# Patient Record
Sex: Female | Born: 1980 | Race: White | Hispanic: No | Marital: Married | Smoking: Never smoker
Health system: Southern US, Community
[De-identification: ages and names within clinical notes are randomized; demographics above are authoritative.]

## PROBLEM LIST (undated history)

## (undated) DIAGNOSIS — G43909 Migraine, unspecified, not intractable, without status migrainosus: Secondary | ICD-10-CM

---

## 2019-03-17 ENCOUNTER — Other Ambulatory Visit: Payer: Self-pay | Admitting: Internal Medicine

## 2019-03-17 DIAGNOSIS — Z20822 Contact with and (suspected) exposure to covid-19: Secondary | ICD-10-CM

## 2019-03-18 LAB — NOVEL CORONAVIRUS, NAA: SARS-CoV-2, NAA: NOT DETECTED

## 2019-04-25 ENCOUNTER — Ambulatory Visit
Admission: EM | Admit: 2019-04-25 | Discharge: 2019-04-25 | Disposition: A | Discharge status: Home / Self Care | Payer: BC Managed Care – PPO | Attending: Family Medicine | Admitting: Family Medicine

## 2019-04-25 ENCOUNTER — Encounter: Payer: Self-pay | Admitting: Emergency Medicine

## 2019-04-25 ENCOUNTER — Other Ambulatory Visit: Payer: Self-pay

## 2019-04-25 DIAGNOSIS — B9689 Other specified bacterial agents as the cause of diseases classified elsewhere: Secondary | ICD-10-CM

## 2019-04-25 DIAGNOSIS — N390 Urinary tract infection, site not specified: Secondary | ICD-10-CM

## 2019-04-25 DIAGNOSIS — R300 Dysuria: Secondary | ICD-10-CM

## 2019-04-25 DIAGNOSIS — R319 Hematuria, unspecified: Secondary | ICD-10-CM | POA: Diagnosis not present

## 2019-04-25 HISTORY — DX: Migraine, unspecified, not intractable, without status migrainosus: G43.909

## 2019-04-25 LAB — URINALYSIS, COMPLETE (UACMP) WITH MICROSCOPIC
Bilirubin Urine: NEGATIVE
Glucose, UA: NEGATIVE mg/dL
Ketones, ur: NEGATIVE mg/dL
Nitrite: NEGATIVE
Protein, ur: NEGATIVE mg/dL
Specific Gravity, Urine: 1.01 (ref 1.005–1.030)
pH: 6 (ref 5.0–8.0)

## 2019-04-25 MED ORDER — CEPHALEXIN 500 MG PO CAPS: 500.0000 mg | ORAL_CAPSULE | Freq: Two times a day (BID) | ORAL | 0 refills | Status: AC

## 2019-04-25 MED ORDER — FLUCONAZOLE 150 MG PO TABS: 150.0000 mg | ORAL_TABLET | Freq: Every day | ORAL | 0 refills | Status: DC

## 2019-04-25 NOTE — ED Provider Notes (Signed)
[facility] ____________________________________________  Time seen: Approximately 6:28 PM  I have reviewed the triage vital signs and the nursing notes.   HISTORY  Chief Complaint Dysuria and Urinary Urgency  HPI Megan Arias is a 38 y.o. female presenting for evaluation of 2 days of urinary frequency, urgency and some burning with urination.  Some vaginal irritation, denies discharge.  Denies abdominal pain, back pain, fevers, vomiting.  Did have brief episodes of diarrhea a few days ago prior to this onset.  Reports otherwise doing well.  Continues eat and drink well.  Denies aggravating or alleviating factors.   Past Medical History:  Diagnosis Date  . Migraines     There are no active problems to display for this patient.   History reviewed. No pertinent surgical history.   No current facility-administered medications for this encounter.   Current Outpatient Medications:  .  cephALEXin (KEFLEX) 500 MG capsule, Take 1 capsule (500 mg total) by mouth 2 (two) times daily for 7 days., Disp: 14 capsule, Rfl: 0 .  fluconazole (DIFLUCAN) 150 MG tablet, Take 1 tablet (150 mg total) by mouth daily. Take one pill orally, then Repeat in one week, Disp: 2 tablet, Rfl: 0  Allergies Patient has no known allergies.  History reviewed. No pertinent family history.  Social History Social History   Tobacco Use  . Smoking status: Never Smoker  . Smokeless tobacco: Never Used  Substance Use Topics  . Alcohol use: Never    Frequency: Never  . Drug use: Never    Review of Systems Constitutional: No fever Cardiovascular: Denies chest pain. Respiratory: Denies shortness of breath. Gastrointestinal: No abdominal pain.   Genitourinary: Positive for dysuria. Musculoskeletal: Negative for back pain. Skin: Negative for rash.   ____________________________________________   PHYSICAL EXAM:  VITAL SIGNS: ED Triage Vitals  Enc Vitals Group     BP 04/25/19 1650  (!) 134/93     Pulse Rate 04/25/19 1650 88     Resp 04/25/19 1650 18     Temp 04/25/19 1650 98.4 F (36.9 C)     Temp Source 04/25/19 1650 Oral     SpO2 04/25/19 1650 100 %     Weight 04/25/19 1648 250 lb (113.4 kg)     Height 04/25/19 1648 5\' 7"  (1.702 m)     Head Circumference --      Peak Flow --      Pain Score 04/25/19 1648 0     Pain Loc --      Pain Edu? --      Excl. in [place]? --     Constitutional: Alert and oriented. Well appearing and in no acute distress. Eyes: Conjunctivae are normal.  ENT      Head: Normocephalic and atraumatic. Cardiovascular: Normal rate, regular rhythm. Grossly normal heart sounds.  Good peripheral circulation. Respiratory: Normal respiratory effort without tachypnea nor retractions. Breath sounds are clear and equal bilaterally. No wheezes, rales, rhonchi. Gastrointestinal: Soft and nontender. No CVA tenderness. Musculoskeletal:  Steady gait.  Neurologic:  Normal speech and language. No gross focal neurologic deficits are appreciated. Speech is normal. No gait instability.  Skin:  Skin is warm, dry Psychiatric: Mood and affect are normal. Speech and behavior are normal. Patient exhibits appropriate insight and judgment   ___________________________________________   LABS (all labs ordered are listed, but only abnormal results are displayed)  Labs Reviewed  URINALYSIS, COMPLETE (UACMP) WITH MICROSCOPIC - Abnormal; Notable for the following components:      Result Value  Hgb urine dipstick TRACE (*)    Leukocytes,Ua SMALL (*)    Bacteria, UA FEW (*)    All other components within normal limits  URINE CULTURE     PROCEDURES Procedures   INITIAL IMPRESSION / ASSESSMENT AND PLAN / ED COURSE  Pertinent labs & imaging results that were available during my care of the patient were reviewed by me and considered in my medical decision making (see chart for details).  Well-appearing patient.  No acute distress.  Urinalysis reviewed, suspect  UTI, yeast also noted.  Will treat with oral Keflex and Diflucan.  Rest, fluids, supportive care.Discussed indication, risks and benefits of medications with patient.  Discussed follow up with Primary care physician this week. Discussed follow up and return parameters including no resolution or any worsening concerns. Patient verbalized understanding and agreed to plan.   ____________________________________________   FINAL CLINICAL IMPRESSION(S) / ED DIAGNOSES  Final diagnoses:  Urinary tract infection with hematuria, site unspecified  Dysuria     ED Discharge Orders         Ordered    cephALEXin (KEFLEX) 500 MG capsule  2 times daily     04/25/19 1721    fluconazole (DIFLUCAN) 150 MG tablet  Daily     04/25/19 1721           Note: This dictation was prepared with Dragon dictation along with smaller phrase technology. Any transcriptional errors that result from this process are unintentional.         Renford Dills, NP 04/25/19 1831

## 2019-04-25 NOTE — ED Triage Notes (Signed)
Patient c/o urgency and frequency that started yesterday. She states this morning she started having dysuria.

## 2019-04-25 NOTE — Discharge Instructions (Addendum)
Take medication as prescribed. Rest. Drink plenty of fluids.  ° °Follow up with your primary care physician this week as needed. Return to Urgent care for new or worsening concerns.  ° °

## 2019-04-26 LAB — URINE CULTURE

## 2019-07-17 ENCOUNTER — Encounter: Payer: Self-pay | Admitting: Emergency Medicine

## 2019-07-17 ENCOUNTER — Emergency Department
Admission: EM | Admit: 2019-07-17 | Discharge: 2019-07-17 | Disposition: A | Discharge status: Home / Self Care | Payer: BC Managed Care – PPO | Attending: Emergency Medicine | Admitting: Emergency Medicine

## 2019-07-17 ENCOUNTER — Emergency Department: Payer: BC Managed Care – PPO

## 2019-07-17 ENCOUNTER — Other Ambulatory Visit: Payer: Self-pay

## 2019-07-17 DIAGNOSIS — R2242 Localized swelling, mass and lump, left lower limb: Secondary | ICD-10-CM | POA: Insufficient documentation

## 2019-07-17 DIAGNOSIS — L539 Erythematous condition, unspecified: Secondary | ICD-10-CM | POA: Insufficient documentation

## 2019-07-17 DIAGNOSIS — R079 Chest pain, unspecified: Secondary | ICD-10-CM | POA: Diagnosis present

## 2019-07-17 DIAGNOSIS — R0789 Other chest pain: Secondary | ICD-10-CM | POA: Insufficient documentation

## 2019-07-17 DIAGNOSIS — M79662 Pain in left lower leg: Secondary | ICD-10-CM | POA: Diagnosis not present

## 2019-07-17 DIAGNOSIS — M79605 Pain in left leg: Secondary | ICD-10-CM

## 2019-07-17 LAB — CBC
HCT: 41.1 % (ref 36.0–46.0)
Hemoglobin: 14 g/dL (ref 12.0–15.0)
MCH: 27.9 pg (ref 26.0–34.0)
MCHC: 34.1 g/dL (ref 30.0–36.0)
MCV: 82 fL (ref 80.0–100.0)
Platelets: 337 10*3/uL (ref 150–400)
RBC: 5.01 MIL/uL (ref 3.87–5.11)
RDW: 13 % (ref 11.5–15.5)
WBC: 7.9 10*3/uL (ref 4.0–10.5)
nRBC: 0 % (ref 0.0–0.2)

## 2019-07-17 LAB — BASIC METABOLIC PANEL
Anion gap: 10 (ref 5–15)
BUN: 8 mg/dL (ref 6–20)
CO2: 26 mmol/L (ref 22–32)
Calcium: 9.2 mg/dL (ref 8.9–10.3)
Chloride: 104 mmol/L (ref 98–111)
Creatinine, Ser: 0.6 mg/dL (ref 0.44–1.00)
GFR calc Af Amer: >60 mL/min (ref >60)
GFR calc non Af Amer: >60 mL/min (ref >60)
Glucose, Bld: 99 mg/dL (ref 70–99)
Potassium: 3.9 mmol/L (ref 3.5–5.1)
Sodium: 140 mmol/L (ref 135–145)

## 2019-07-17 LAB — TROPONIN I (HIGH SENSITIVITY)
Troponin I (High Sensitivity): <2 ng/L (ref <18)
Troponin I (High Sensitivity): <2 ng/L (ref <18)

## 2019-07-17 MED ORDER — SODIUM CHLORIDE 0.9% FLUSH: 3.0000 mL | Freq: Once | INTRAVENOUS | Status: DC

## 2019-07-17 NOTE — ED Notes (Signed)
Pt declines to have COVID test done at this time.

## 2019-07-17 NOTE — ED Triage Notes (Signed)
Arrives from [facility] for ED evaluation for chest pain (since Christmas Day)  and left lower leg pain (x 1 day).  States today she noticed left lower leg looking slightly reddened.  AAOx3.  Skin warm and dry. NAD

## 2019-07-17 NOTE — ED Notes (Signed)
Pt states that she woke up this morning with pain in her left calf. Pt states that she also noticed some redness. No redness noted at this time. Skin color is WNL, no edema noted, skin temperature is WNL.

## 2019-07-17 NOTE — ED Notes (Signed)
Pt reports having intermittent, sharp chest pain in her central chest since Christmas Day. Pt states that the pain only last about 5 seconds. Pt reports that she feels like her resting heart rate has been elevated as well. Pt states that according to her iWatch it has been around 88-90. Pt states that this is not normal for her. Pt Denies shortness of breath, dizziness, or radiation of her pain. Pt denies cardiac hx.

## 2019-07-17 NOTE — Discharge Instructions (Signed)
Your exam, labs, [place], and CXR are all normal today. You do not have any indication of a heart attack or DVT. Continue to monitor symptoms and select with a primary care provider. It is also possible, with this normal work-up, that your symptoms are related to stress or anxiety. Monitor symptoms and return as needed.

## 2019-07-17 NOTE — ED Provider Notes (Signed)
[facility] Emergency Department Provider Note ____________________________________________  Time seen: 1825  I have reviewed the triage vital signs and the nursing notes.  HISTORY  Chief Complaint  Chest Pain and Leg Pain  HPI Megan Arias is a 38 y.o. female presents to the ED from [facility] for evaluation of a 2-day complaint of central chest pain.   Patient describes achy pain across the central chest from the right to the left. She reports intermittent episodes lasting only a few seconds.  He denies any referral of the pain, nausea, vomiting, diaphoresis, or syncope.  Patient gives no report of underlying cardiac history, noting only that she had a similar episode several years ago, that was attributed to anxiety.  She also notes that her posterior left calf looks slightly reddened and swollen yesterday.  She describes pain that was like a "charley horse" to the posterior calf.  Combination of findings caused patient to be concerned enough to report to [facility] today.  They referred her here for cardiac work-up.  Past Medical History:  Diagnosis Date  . Migraines     There are no problems to display for this patient.   History reviewed. No pertinent surgical history.  Prior to Admission medications   Medication Sig Start Date End Date Taking? Authorizing Provider  fluconazole (DIFLUCAN) 150 MG tablet Take 1 tablet (150 mg total) by mouth daily. Take one pill orally, then Repeat in one week 04/25/19   Renford Dills, NP   Allergies Patient has no known allergies.  History reviewed. No pertinent family history.  Social History Social History   Tobacco Use  . Smoking status: Never Smoker  . Smokeless tobacco: Never Used  Substance Use Topics  . Alcohol use: Never  . Drug use: Never    Review of Systems  Constitutional: Negative for fever. Eyes: Negative for visual changes. ENT: Negative for sore throat. Cardiovascular: Positive for chest  pain. Respiratory: Negative for shortness of breath. Gastrointestinal: Negative for abdominal pain, vomiting and diarrhea. Genitourinary: Negative for dysuria. Musculoskeletal: Negative for back pain.  Reports left calf pain as above. Skin: Negative for rash. Neurological: Negative for headaches, focal weakness or numbness. ____________________________________________  PHYSICAL EXAM:  VITAL SIGNS: ED Triage Vitals  Enc Vitals Group     BP 07/17/19 1529 122/79     Pulse Rate 07/17/19 1529 86     Resp 07/17/19 1529 18     Temp 07/17/19 1529 97.8 F (36.6 C)     Temp Source 07/17/19 1529 Oral     SpO2 07/17/19 1529 95 %     Weight 07/17/19 1518 250 lb (113.4 kg)     Height 07/17/19 1529 5\' 7"  (1.702 m)     Head Circumference --      Peak Flow --      Pain Score 07/17/19 1517 5     Pain Loc --      Pain Edu? --      Excl. in [place]? --     Constitutional: Alert and oriented. Well appearing and in no distress. Head: Normocephalic and atraumatic. Eyes: Conjunctivae are normal. Normal extraocular movements Cardiovascular: Normal rate, regular rhythm. Normal distal pulses.  No murmurs, rubs, or gallops. Respiratory: Normal respiratory effort. No wheezes/rales/rhonchi. Gastrointestinal: Soft and nontender. No distention. Musculoskeletal: Nontender with normal range of motion in all extremities.  Neurologic:  Normal gait without ataxia. Normal speech and language. No gross focal neurologic deficits are appreciated. Skin:  Skin is warm, dry and intact. No  rash noted. Psychiatric: Mood and affect are normal. Patient exhibits appropriate insight and judgment. ____________________________________________   LABS (pertinent positives/negatives) Labs Reviewed  SARS CORONAVIRUS 2 (TAT 6-24 HRS)  BASIC METABOLIC PANEL  CBC  POC URINE PREG, ED  TROPONIN I (HIGH SENSITIVITY)  TROPONIN I (HIGH SENSITIVITY)  ____________________________________________  EKG  NSR 78 bpm PR interval 144  ms QRS duration 82 ms Normal axis No STEMI ____________________________________________   RADIOLOGY  CXR IMPRESSION: No active cardiopulmonary disease.  LLE US Doppler  IMPRESSION: No evidence of deep venous thrombosis ____________________________________________  PROCEDURES  Procedures ____________________________________________  INITIAL IMPRESSION / ASSESSMENT AND PLAN / ED COURSE  DDX: ACS, AMI, CAP, angina, DVT, MSK pain  Patient with a 2-day complaint of central chest pain and LLE calf pain. She reports intermittent, fleeting chest pain without referral, diaphoresis, or exertional influence. Her exam, labs, EKG, [place], CXR are all negative and reassuring. Her HEART score is 0 based on her risk factors. She is stable for discharge as she is stable, and does not appear to have a cardiac etiology. She is reassured by her work-up. She is referred to a local PCP to establish care. Return precautions are reviewed.   Megan Arias was evaluated in Emergency Department on 07/17/2019 for the symptoms described in the history of present illness. She was evaluated in the context of the global COVID-19 pandemic, which necessitated consideration that the patient might be at risk for infection with the SARS-CoV-2 virus that causes COVID-19. Institutional protocols and algorithms that pertain to the evaluation of patients at risk for COVID-19 are in a state of rapid change based on information released by regulatory bodies including the CDC and federal and state organizations. These policies and algorithms were followed during the patient's care in the ED. ____________________________________________  FINAL CLINICAL IMPRESSION(S) / ED DIAGNOSES  Final diagnoses:  Atypical chest pain  Left leg pain      Victorine Mcnee, Dannielle Karvonen, PA-C 07/17/19 2040    Nance Pear, MD 07/17/19 2116

## 2020-08-09 ENCOUNTER — Encounter: Payer: Self-pay | Admitting: Emergency Medicine

## 2020-08-09 ENCOUNTER — Other Ambulatory Visit: Payer: Self-pay

## 2020-08-09 ENCOUNTER — Ambulatory Visit
Admission: EM | Admit: 2020-08-09 | Discharge: 2020-08-09 | Disposition: A | Discharge status: Home / Self Care | Payer: BC Managed Care – PPO | Attending: Sports Medicine | Admitting: Sports Medicine

## 2020-08-09 DIAGNOSIS — R0989 Other specified symptoms and signs involving the circulatory and respiratory systems: Secondary | ICD-10-CM | POA: Insufficient documentation

## 2020-08-09 DIAGNOSIS — U071 COVID-19: Secondary | ICD-10-CM | POA: Diagnosis not present

## 2020-08-09 DIAGNOSIS — M791 Myalgia, unspecified site: Secondary | ICD-10-CM | POA: Insufficient documentation

## 2020-08-09 DIAGNOSIS — R6889 Other general symptoms and signs: Secondary | ICD-10-CM | POA: Insufficient documentation

## 2020-08-09 DIAGNOSIS — J069 Acute upper respiratory infection, unspecified: Secondary | ICD-10-CM | POA: Diagnosis not present

## 2020-08-09 DIAGNOSIS — Z20822 Contact with and (suspected) exposure to covid-19: Secondary | ICD-10-CM | POA: Diagnosis not present

## 2020-08-09 MED ORDER — BENZONATATE 100 MG PO CAPS: 100.0000 mg | ORAL_CAPSULE | Freq: Three times a day (TID) | ORAL | 0 refills | Status: DC

## 2020-08-09 MED ORDER — AZITHROMYCIN 250 MG PO TABS: 250.0000 mg | ORAL_TABLET | Freq: Every day | ORAL | 0 refills | Status: DC

## 2020-08-09 NOTE — ED Triage Notes (Signed)
Patient c/o productive cough and chest congestion that started 5 days ago.  Patient denies recent fevers.  Patient states that she had covid test done on last Thursday and was negative.

## 2020-08-09 NOTE — Discharge Instructions (Addendum)
We will go ahead and get a repeat COVID test.  I have asked her to assume that she is positive until the test comes back negative.  She will isolate accordingly.  If she is positive she will need to quarantine per current CDC guidelines.  If she is negative then she can come out of quarantine and return to work on Monday, 24 January. Given that she has had her symptoms now for more than a week I will go ahead and treat her for an atypical process.  Gave her a Z-Pak.  Also add an Lawyer. Supportive care, over-the-counter meds as needed, Tylenol or Motrin for fever or discomfort. Educational handouts provided. Provided a work note. Follow-up here as needed.

## 2020-08-09 NOTE — ED Provider Notes (Addendum)
[facility]    CSN: [id number] Arrival date & time: 08/09/20  1158      History   Chief Complaint Chief Complaint  Patient presents with  . Cough    HPI Megan Arias is a 40 y.o. female.   Patient is a pleasant 40 year old female who presents for evaluation of above issues.  On further history it appears as though she has been having her symptoms now for a little more than a week.  Started with fevers and chills and headaches with myalgia and flulike symptoms.  She did get a COVID test done in [place] that was negative.  Unfortunately her symptoms have persisted.  Her headache and myalgia is actually improving but now she is having worsening cough chest congestion with tightness.  Her cough is productive.  She is also complaining of a sore throat.  Still having myalgias but not as bad.  No wheezing or history of asthma.  She is a non-smoker.  She has been vaccinated just 1 time and she was sick afterwards and did not take the second vaccine.  She has not taken the flu shot either.  No nausea vomiting or diarrhea.  No urinary symptoms or abdominal symptoms.  She has been using over-the-counter medicine such as Mucinex and Motrin with limited success.  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  No red flag signs or symptoms elicited on history.     Past Medical History:  Diagnosis Date  . Migraines     There are no problems to display for this patient.   History reviewed. No pertinent surgical history.  OB History   No obstetric history on file.      Home Medications    Prior to Admission medications   Medication Sig Start Date End Date Taking? Authorizing Provider  azithromycin (ZITHROMAX) 250 MG tablet Take 1 tablet (250 mg total) by mouth daily. Take first 2 tablets together, then 1 every day until finished. 08/09/20  Yes Delton See, MD  benzonatate (TESSALON) 100 MG capsule Take 1 capsule (100 mg total) by mouth every 8  (eight) hours. 08/09/20  Yes Delton See, MD  fluconazole (DIFLUCAN) 150 MG tablet Take 1 tablet (150 mg total) by mouth daily. Take one pill orally, then Repeat in one week 04/25/19   Renford Dills, NP  rizatriptan (MAXALT) 10 MG tablet Take by mouth. 08/01/20   [provider]  SUMAtriptan (IMITREX) 100 MG tablet Take by mouth. 08/01/20   [provider]    Family History History reviewed. No pertinent family history.  Social History Social History   Tobacco Use  . Smoking status: Never Smoker  . Smokeless tobacco: Never Used  Vaping Use  . Vaping Use: Never used  Substance Use Topics  . Alcohol use: Never  . Drug use: Never     Allergies   Patient has no known allergies.   Review of Systems Review of Systems  Constitutional: Positive for chills and fever. Negative for activity change, appetite change and diaphoresis.  HENT: Positive for congestion, rhinorrhea and sore throat. Negative for ear discharge, ear pain, sinus pressure and sinus pain.   Eyes: Negative for pain.  Respiratory: Positive for cough and chest tightness. Negative for shortness of breath, wheezing and stridor.   Cardiovascular: Negative for chest pain and palpitations.  Gastrointestinal: Negative for abdominal pain.  Genitourinary: Negative for dysuria.  Musculoskeletal: Positive for myalgias.  Skin: Negative for color change, pallor, rash  and wound.  Neurological: Positive for headaches. Negative for dizziness, tremors, syncope and light-headedness.  All other systems reviewed and are negative.    Physical Exam Triage Vital Signs ED Triage Vitals  Enc Vitals Group     BP 08/09/20 1317 (!) 129/94     Pulse Rate 08/09/20 1317 82     Resp 08/09/20 1317 14     Temp 08/09/20 1317 98 F (36.7 C)     Temp Source 08/09/20 1317 Oral     SpO2 08/09/20 1317 98 %     Weight 08/09/20 1315 230 lb (104.3 kg)     Height 08/09/20 1315 5\' 7"  (1.702 m)     Head Circumference --      Peak  Flow --      Pain Score 08/09/20 1315 2     Pain Loc --      Pain Edu? --      Excl. in [place]? --    No data found.  Updated Vital Signs BP (!) 129/94 (BP Location: Left Arm)   Pulse 82   Temp 98 F (36.7 C) (Oral)   Resp 14   Ht 5\' 7"  (1.702 m)   Wt 104.3 kg   LMP 08/02/2020 (Approximate)   SpO2 98%   BMI 36.02 kg/m   Visual Acuity Right Eye Distance:   Left Eye Distance:   Bilateral Distance:    Right Eye Near:   Left Eye Near:    Bilateral Near:     Physical Exam Vitals and nursing note reviewed.  Constitutional:      General: She is not in acute distress.    Appearance: Normal appearance. She is not ill-appearing, toxic-appearing or diaphoretic.  HENT:     Head: Normocephalic and atraumatic.     Right Ear: Tympanic membrane normal.     Left Ear: Tympanic membrane normal.     Nose: Congestion present. No rhinorrhea.     Mouth/Throat:     Mouth: Mucous membranes are moist.     Pharynx: Posterior oropharyngeal erythema present. No oropharyngeal exudate.  Eyes:     Extraocular Movements: Extraocular movements intact.     Conjunctiva/sclera: Conjunctivae normal.     Pupils: Pupils are equal, round, and reactive to light.  Cardiovascular:     Rate and Rhythm: Normal rate and regular rhythm.     Pulses: Normal pulses.     Heart sounds: Normal heart sounds. No murmur heard. No friction rub. No gallop.   Pulmonary:     Effort: Pulmonary effort is normal. No respiratory distress.     Breath sounds: Normal breath sounds. No stridor. No wheezing, rhonchi or rales.     Comments: Patient is coughing throughout auscultation. Musculoskeletal:     Cervical back: No rigidity or tenderness.  Lymphadenopathy:     Cervical: Cervical adenopathy present.  Skin:    General: Skin is dry.     Capillary Refill: Capillary refill takes less than 2 seconds.  Neurological:     General: No focal deficit present.     Mental Status: She is alert and oriented to person, place, and  time.  Psychiatric:        Mood and Affect: Mood normal.        Behavior: Behavior normal.      UC Treatments / Results  Labs (all labs ordered are listed, but only abnormal results are displayed) Labs Reviewed  SARS CORONAVIRUS 2 (TAT 6-24 HRS)    EKG   Radiology No  results found.  Procedures Procedures (including critical care time)  Medications Ordered in UC Medications - No data to display  Initial Impression / Assessment and Plan / UC Course  I have reviewed the triage vital signs and the nursing notes.  Pertinent labs & imaging results that were available during my care of the patient were reviewed by me and considered in my medical decision making (see chart for details).  Clinical impression: more than 1 week of flulike symptoms.  Did have a negative COVID test a week ago.  Her symptoms are persisting.  Her cough is productive now.  She has chest tightness and congestion.  Treatment plan: 1.  The findings and treatment plan were discussed in detail with the patient.  Patient was in agreement. 2.  We will go ahead and get a repeat COVID test.  I have asked her to assume that she is positive until the test comes back negative.  She will isolate accordingly.  If she is positive she will need to quarantine per current CDC guidelines.  If she is negative then she can come out of quarantine and return to work on Monday, 24 January. 3.  Given that she has had her symptoms now for more than a week I will go ahead and treat her for an atypical process.  Gave her a Z-Pak.  Also add an Lawyer. 4.  Supportive care, over-the-counter meds as needed, Tylenol or Motrin for fever or discomfort. 5.  Educational handouts provided. 6.  Provided a work note. 7.  Follow-up here as needed.    Final Clinical Impressions(s) / UC Diagnoses   Final diagnoses:  Viral URI with cough  Flu-like symptoms  Close exposure to COVID-19 virus  Chest congestion  Myalgia      Discharge Instructions     We will go ahead and get a repeat COVID test.  I have asked her to assume that she is positive until the test comes back negative.  She will isolate accordingly.  If she is positive she will need to quarantine per current CDC guidelines.  If she is negative then she can come out of quarantine and return to work on Monday, 24 January. Given that she has had her symptoms now for more than a week I will go ahead and treat her for an atypical process.  Gave her a Z-Pak.  Also add an Lawyer. Supportive care, over-the-counter meds as needed, Tylenol or Motrin for fever or discomfort. Educational handouts provided. Provided a work note. Follow-up here as needed.    ED Prescriptions    Medication Sig Dispense Auth. Provider   azithromycin (ZITHROMAX) 250 MG tablet Take 1 tablet (250 mg total) by mouth daily. Take first 2 tablets together, then 1 every day until finished. 6 tablet Delton See, MD   benzonatate (TESSALON) 100 MG capsule Take 1 capsule (100 mg total) by mouth every 8 (eight) hours. 21 capsule Delton See, MD     PDMP not reviewed this encounter.   Delton See, MD 08/09/20 1435    Delton See, MD 08/09/20 [phone]

## 2020-08-10 LAB — SARS CORONAVIRUS 2 (TAT 6-24 HRS): SARS Coronavirus 2: POSITIVE — AB

## 2020-08-25 ENCOUNTER — Other Ambulatory Visit: Payer: Self-pay

## 2020-08-25 ENCOUNTER — Ambulatory Visit
Admission: EM | Admit: 2020-08-25 | Discharge: 2020-08-25 | Disposition: A | Discharge status: Home / Self Care | Payer: BC Managed Care – PPO | Attending: Sports Medicine | Admitting: Sports Medicine

## 2020-08-25 ENCOUNTER — Encounter: Payer: Self-pay | Admitting: Emergency Medicine

## 2020-08-25 DIAGNOSIS — M542 Cervicalgia: Secondary | ICD-10-CM

## 2020-08-25 DIAGNOSIS — G43901 Migraine, unspecified, not intractable, with status migrainosus: Secondary | ICD-10-CM | POA: Diagnosis not present

## 2020-08-25 MED ORDER — TIZANIDINE HCL 4 MG PO TABS: 4.0000 mg | ORAL_TABLET | Freq: Every day | ORAL | 0 refills | Status: AC | PRN

## 2020-08-25 MED ORDER — KETOROLAC TROMETHAMINE 60 MG/2ML IM SOLN
60.0000 mg | Freq: Once | INTRAMUSCULAR | Status: AC
  Administered 2020-08-25: 60 mg via INTRAMUSCULAR

## 2020-08-25 MED ORDER — KETOROLAC TROMETHAMINE 10 MG PO TABS: 10.0000 mg | ORAL_TABLET | Freq: Four times a day (QID) | ORAL | 0 refills | Status: AC | PRN

## 2020-08-25 MED ORDER — RIZATRIPTAN BENZOATE 10 MG PO TABS: 10.0000 mg | ORAL_TABLET | ORAL | 0 refills | Status: AC | PRN

## 2020-08-25 NOTE — ED Provider Notes (Signed)
[facility]    CSN: [id number] Arrival date & time: 08/25/20  1328      History   Chief Complaint Chief Complaint  Patient presents with  . Headache    HPI Megan Arias is a 40 y.o. female presenting for 4-day history of constant left-sided headache.  She admits to some associated photophobia, nausea, and mental fogginess.  Patient states that she does have a history of migraines and typically gets about 2 migraines per month.  She says that she was diagnosed with COVID-19 2 weeks ago and has been getting more frequent headaches/migraines.  Patient says that she has taken rizatriptan or sumatriptan in the past for migraines and they have worked well for her.  She states that she is currently out of these medications.  She says she start over-the-counter ibuprofen and Tylenol without any relief.  Patient says this is not the worst headache she is ever had.  She denies any red flag signs or symptoms.  No head trauma.  She denies any syncope, loss of vision or double vision, vomiting, numbness, tingling or weakness.  She denies any continued COVID symptoms.  No fever, cough or breathing difficulty.  Patient says that she sees a chiropractor to get adjustments on her back and neck and states that when she went today they advised her that she has a lot of neck tension and suggested that she ask someone about getting a muscle relaxer.  Patient says she has good range of motion of the neck but does have some pain.  Denies any radiation of pain to the extremities.  Denies any other complaints or concerns today.  HPI  Past Medical History:  Diagnosis Date  . Migraines     There are no problems to display for this patient.   History reviewed. No pertinent surgical history.  OB History   No obstetric history on file.      Home Medications    Prior to Admission medications   Medication Sig Start Date End Date Taking? Authorizing Provider  ketorolac (TORADOL) 10 MG tablet  Take 1 tablet (10 mg total) by mouth every 6 (six) hours as needed for up to 5 days for moderate pain or severe pain. 08/25/20 08/30/20 Yes Eusebio Friendly B, PA-C  rizatriptan (MAXALT) 10 MG tablet Take 1 tablet (10 mg total) by mouth as needed for migraine. May repeat in 2 hours if needed 08/25/20 09/24/20 Yes Eusebio Friendly B, PA-C  SUMAtriptan (IMITREX) 100 MG tablet Take by mouth. 08/01/20  Yes [provider]  tiZANidine (ZANAFLEX) 4 MG tablet Take 1 tablet (4 mg total) by mouth daily as needed for up to 15 days for muscle spasms. 08/25/20 09/09/20 Yes Shirlee Latch, PA-C  azithromycin (ZITHROMAX) 250 MG tablet Take 1 tablet (250 mg total) by mouth daily. Take first 2 tablets together, then 1 every day until finished. 08/09/20   Delton See, MD  benzonatate (TESSALON) 100 MG capsule Take 1 capsule (100 mg total) by mouth every 8 (eight) hours. 08/09/20   Delton See, MD  fluconazole (DIFLUCAN) 150 MG tablet Take 1 tablet (150 mg total) by mouth daily. Take one pill orally, then Repeat in one week 04/25/19   Renford Dills, NP    Family History History reviewed. No pertinent family history.  Social History Social History   Tobacco Use  . Smoking status: Never Smoker  . Smokeless tobacco: Never Used  Vaping Use  . Vaping Use: Never used  Substance Use Topics  .  Alcohol use: Never  . Drug use: Never     Allergies   Patient has no known allergies.   Review of Systems Review of Systems  Constitutional: Negative for chills, diaphoresis, fatigue and fever.  HENT: Negative for congestion, ear pain, rhinorrhea, sinus pressure, sinus pain and sore throat.   Eyes: Positive for photophobia. Negative for visual disturbance.  Respiratory: Negative for cough and shortness of breath.   Gastrointestinal: Positive for nausea. Negative for abdominal pain and vomiting.  Musculoskeletal: Negative for arthralgias and myalgias.  Skin: Negative for rash.  Neurological: Positive for headaches.  Negative for dizziness, syncope, facial asymmetry, weakness, light-headedness and numbness.  Hematological: Negative for adenopathy.  Psychiatric/Behavioral:       Mental fogginess     Physical Exam Triage Vital Signs ED Triage Vitals  Enc Vitals Group     BP 08/25/20 1338 107/62     Pulse Rate 08/25/20 1338 80     Resp 08/25/20 1338 14     Temp 08/25/20 1338 98.2 F (36.8 C)     Temp Source 08/25/20 1338 Oral     SpO2 08/25/20 1338 98 %     Weight 08/25/20 1336 230 lb (104.3 kg)     Height 08/25/20 1336 5\' 7"  (1.702 m)     Head Circumference --      Peak Flow --      Pain Score 08/25/20 1335 5     Pain Loc --      Pain Edu? --      Excl. in GC? --    No data found.  Updated Vital Signs BP 107/62 (BP Location: Left Arm)   Pulse 80   Temp 98.2 F (36.8 C) (Oral)   Resp 14   Ht 5\' 7"  (1.702 m)   Wt 230 lb (104.3 kg)   LMP 08/02/2020 (Approximate)   SpO2 98%   BMI 36.02 kg/m       Physical Exam Vitals and nursing note reviewed.  Constitutional:      General: She is not in acute distress.    Appearance: Normal appearance. She is not ill-appearing or toxic-appearing.  HENT:     Head: Normocephalic and atraumatic.     Right Ear: Tympanic membrane, ear canal and external ear normal.     Left Ear: Tympanic membrane, ear canal and external ear normal.     Nose: Nose normal.     Mouth/Throat:     Mouth: Mucous membranes are moist.     Pharynx: Oropharynx is clear.  Eyes:     General: No scleral icterus.       Right eye: No discharge.        Left eye: No discharge.     Extraocular Movements: Extraocular movements intact.     Conjunctiva/sclera: Conjunctivae normal.     Pupils: Pupils are equal, round, and reactive to light.  Cardiovascular:     Rate and Rhythm: Normal rate and regular rhythm.     Heart sounds: Normal heart sounds.  Pulmonary:     Effort: Pulmonary effort is normal. No respiratory distress.     Breath sounds: Normal breath sounds. No wheezing,  rhonchi or rales.  Musculoskeletal:     Cervical back: Normal range of motion and neck supple. Tenderness (diffuse mild/moderate TTP along c-spine ) present. No rigidity.  Skin:    General: Skin is dry.  Neurological:     General: No focal deficit present.     Mental Status: She is alert and  oriented to person, place, and time. Mental status is at baseline.     Cranial Nerves: No cranial nerve deficit.     Motor: No weakness.     Gait: Gait normal.  Psychiatric:        Mood and Affect: Mood normal.        Behavior: Behavior normal.        Thought Content: Thought content normal.      UC Treatments / Results  Labs (all labs ordered are listed, but only abnormal results are displayed) Labs Reviewed - No data to display  EKG   Radiology No results found.  Procedures Procedures (including critical care time)  Medications Ordered in UC Medications  ketorolac (TORADOL) injection 60 mg (60 mg Intramuscular Given 08/25/20 1440)    Initial Impression / Assessment and Plan / UC Course  I have reviewed the triage vital signs and the nursing notes.  Pertinent labs & imaging results that were available during my care of the patient were reviewed by me and considered in my medical decision making (see chart for details).   40 year old female with history of migraines presenting today for 4-day history of left-sided migraine with associated photophobia, sensitivity to sound, nausea without vomiting, mental fogginess.  Patient fairly well-controlled with rizatriptan in the past.  She is out of this medication now.  Exam is only positive for neck tenderness.  She has full range of motion of the neck.  Neurological exam normal.  No red flag signs or symptoms.  Exam and symptoms consistent with migraine headache with cervicalgia as well--suspsect contributing tension headache.  I have refilled her rizatriptan.  She is given ketorolac injection in the clinic and I have sent a short supply this  medication as well.  Advised she can also take Tylenol.  Tizanidine given for nighttime for the neck tension.  Advised begin with PCP about referral to neurologist since she has never seen one and has had migraine headaches for 8 years.  ED precautions reviewed with patient.  Final Clinical Impressions(s) / UC Diagnoses   Final diagnoses:  Migraine with status migrainosus, not intractable, unspecified migraine type  Neck pain     Discharge Instructions     MIGRAINE: I have refilled the Maxalt for you.  You have been given an injection of ketorolac in the clinic which is an anti-inflammatory.  I prescribed an oral form of this medication as well.  I also sent tizanidine which is a muscle relaxer since you do have some associated neck pain and may have tension type headache as well.  Is fine to take Tylenol with these medications if you need to as well.  I did speak with your PCP about a referral to a neurologist since you have had migraine headaches for 8 years and have never seen a specialist.  Go to ED for any red flag symptoms such as--severe acute worsening or headache, blurred vision, passing out or feeling faint, falls, numbness, weakness or tingling, or increased confusion.  NECK PAIN: Stressed avoiding painful activities. This can exacerbate your symptoms and make them worse.  May apply heat to the areas of pain for some relief. Use medications as directed. Be aware of which medications make you drowsy and do not drive or operate any kind of heavy machinery while using the medication (ie pain medications or muscle relaxers). F/U with PCP for reexamination or return sooner if condition worsens or does not begin to improve over the next few days.  NECK PAIN RED FLAGS: If symptoms get worse than they are right now, you should come back sooner for re-evaluation. If you have increased numbness/ tingling or notice that the numbness/tingling is affecting the legs or saddle region, go to ER. If you  ever lose continence go to ER.        ED Prescriptions    Medication Sig Dispense Auth. Provider   rizatriptan (MAXALT) 10 MG tablet Take 1 tablet (10 mg total) by mouth as needed for migraine. May repeat in 2 hours if needed 10 tablet Eusebio Friendly B, PA-C   tiZANidine (ZANAFLEX) 4 MG tablet Take 1 tablet (4 mg total) by mouth daily as needed for up to 15 days for muscle spasms. 15 tablet Eusebio Friendly B, PA-C   ketorolac (TORADOL) 10 MG tablet Take 1 tablet (10 mg total) by mouth every 6 (six) hours as needed for up to 5 days for moderate pain or severe pain. 20 tablet Gareth Morgan     PDMP not reviewed this encounter.   Shirlee Latch, PA-C 08/25/20 1451

## 2020-08-25 NOTE — Discharge Instructions (Addendum)
MIGRAINE: I have refilled the Maxalt for you.  You have been given an injection of ketorolac in the clinic which is an anti-inflammatory.  I prescribed an oral form of this medication as well.  I also sent tizanidine which is a muscle relaxer since you do have some associated neck pain and may have tension type headache as well.  Is fine to take Tylenol with these medications if you need to as well.  I did speak with your PCP about a referral to a neurologist since you have had migraine headaches for 8 years and have never seen a specialist.  Go to ED for any red flag symptoms such as--severe acute worsening or headache, blurred vision, passing out or feeling faint, falls, numbness, weakness or tingling, or increased confusion.  NECK PAIN: Stressed avoiding painful activities. This can exacerbate your symptoms and make them worse.  May apply heat to the areas of pain for some relief. Use medications as directed. Be aware of which medications make you drowsy and do not drive or operate any kind of heavy machinery while using the medication (ie pain medications or muscle relaxers). F/U with PCP for reexamination or return sooner if condition worsens or does not begin to improve over the next few days.   NECK PAIN RED FLAGS: If symptoms get worse than they are right now, you should come back sooner for re-evaluation. If you have increased numbness/ tingling or notice that the numbness/tingling is affecting the legs or saddle region, go to ER. If you ever lose continence go to ER.

## 2020-08-25 NOTE — ED Triage Notes (Signed)
Patient c/o headache that started on Tuesday.  Patient states that her chiropractor recommended that she rake a muscle relaxer.  Patient states that she had COVID 2 weeks ago.  Patient denies fevers. Patient states that she has a history of migraines.

## 2020-10-08 ENCOUNTER — Ambulatory Visit (INDEPENDENT_AMBULATORY_CARE_PROVIDER_SITE_OTHER): Payer: BC Managed Care – PPO

## 2020-10-08 ENCOUNTER — Other Ambulatory Visit: Payer: Self-pay

## 2020-10-08 ENCOUNTER — Ambulatory Visit
Admission: EM | Admit: 2020-10-08 | Discharge: 2020-10-08 | Disposition: A | Discharge status: Home / Self Care | Payer: BC Managed Care – PPO | Attending: Family Medicine | Admitting: Family Medicine

## 2020-10-08 DIAGNOSIS — J209 Acute bronchitis, unspecified: Secondary | ICD-10-CM

## 2020-10-08 DIAGNOSIS — J40 Bronchitis, not specified as acute or chronic: Secondary | ICD-10-CM | POA: Diagnosis not present

## 2020-10-08 MED ORDER — PREDNISONE 50 MG PO TABS: ORAL_TABLET | ORAL | 0 refills | Status: AC

## 2020-10-08 MED ORDER — ALBUTEROL SULFATE HFA 108 (90 BASE) MCG/ACT IN AERS: 1.0000 | INHALATION_SPRAY | Freq: Four times a day (QID) | RESPIRATORY_TRACT | 0 refills | Status: AC | PRN

## 2020-10-08 MED ORDER — PROMETHAZINE-DM 6.25-15 MG/5ML PO SYRP: 5.0000 mL | ORAL_SOLUTION | Freq: Four times a day (QID) | ORAL | 0 refills | Status: AC | PRN

## 2020-10-08 NOTE — ED Provider Notes (Signed)
[facility]    CSN: [id number] Arrival date & time: 10/08/20  1511      History   Chief Complaint Chief Complaint  Patient presents with  . Cough   HPI  40 year old female presents with the above complaint.  Patient reports 2-week history of symptoms.  She reports cough and chest tightness.  Patient reports ongoing fatigue since having Covid in January.  This has not improved.  No fever.  Cough is productive.  No relieving factors.  No known exacerbating factors.  No other complaints.  Past Medical History:  Diagnosis Date  . Migraines     Home Medications    Prior to Admission medications   Medication Sig Start Date End Date Taking? Authorizing Provider  albuterol (VENTOLIN HFA) 108 (90 Base) MCG/ACT inhaler Inhale 1-2 puffs into the lungs every 6 (six) hours as needed for wheezing or shortness of breath. 10/08/20  Yes Adriana Simas, Emmanuelle Coxe G, DO  predniSONE (DELTASONE) 50 MG tablet 1 tablet daily x 5 days 10/08/20  Yes Abran Gavigan G, DO  promethazine-dextromethorphan (PROMETHAZINE-DM) 6.25-15 MG/5ML syrup Take 5 mLs by mouth 4 (four) times daily as needed for cough. 10/08/20  Yes Maclovia Uher G, DO  rizatriptan (MAXALT) 10 MG tablet Take 1 tablet (10 mg total) by mouth as needed for migraine. May repeat in 2 hours if needed 08/25/20 09/24/20 Yes Eusebio Friendly B, PA-C  SUMAtriptan (IMITREX) 100 MG tablet Take by mouth. 08/01/20   [provider]   Social History Social History   Tobacco Use  . Smoking status: Never Smoker  . Smokeless tobacco: Never Used  Vaping Use  . Vaping Use: Never used  Substance Use Topics  . Alcohol use: Never  . Drug use: Never     Allergies   Patient has no known allergies.   Review of Systems Review of Systems Per HPI  Physical Exam Triage Vital Signs ED Triage Vitals  Enc Vitals Group     BP 10/08/20 1555 121/79     Pulse Rate 10/08/20 1555 80     Resp 10/08/20 1555 18     Temp 10/08/20 1555 98 F (36.7 C)     Temp  Source 10/08/20 1555 Oral     SpO2 10/08/20 1555 99 %     Weight 10/08/20 1554 230 lb (104.3 kg)     Height 10/08/20 1554 5\' 7"  (1.702 m)     Head Circumference --      Peak Flow --      Pain Score 10/08/20 1554 4     Pain Loc --      Pain Edu? --      Excl. in GC? --    Updated Vital Signs BP 121/79 (BP Location: Left Arm)   Pulse 80   Temp 98 F (36.7 C) (Oral)   Resp 18   Ht 5\' 7"  (1.702 m)   Wt 104.3 kg   LMP 10/05/2020   SpO2 99%   BMI 36.02 kg/m   Visual Acuity Right Eye Distance:   Left Eye Distance:   Bilateral Distance:    Right Eye Near:   Left Eye Near:    Bilateral Near:     Physical Exam Vitals and nursing note reviewed.  Constitutional:      General: She is not in acute distress.    Appearance: Normal appearance. She is obese. She is not ill-appearing.  HENT:     Head: Normocephalic and atraumatic.  Eyes:     General:  Right eye: No discharge.        Left eye: No discharge.     Conjunctiva/sclera: Conjunctivae normal.  Cardiovascular:     Rate and Rhythm: Normal rate and regular rhythm.  Pulmonary:     Effort: Pulmonary effort is normal.     Breath sounds: Normal breath sounds. No wheezing or rales.  Neurological:     Mental Status: She is alert.  Psychiatric:        Mood and Affect: Mood normal.        Behavior: Behavior normal.    UC Treatments / Results  Labs (all labs ordered are listed, but only abnormal results are displayed) Labs Reviewed - No data to display  EKG   Radiology DG Chest 2 View  Result Date: 10/08/2020 CLINICAL DATA:  Cough for 11 days.  Chest discomfort. EXAM: CHEST - 2 VIEW COMPARISON:  07/17/2019 FINDINGS: The heart size and mediastinal contours are within normal limits. Both lungs are clear. The visualized skeletal structures are unremarkable. IMPRESSION: No active cardiopulmonary disease. Electronically Signed   By: Danae Orleans M.D.   On: 10/08/2020 17:15    Procedures Procedures (including critical  care time)  Medications Ordered in UC Medications - No data to display  Initial Impression / Assessment and Plan / UC Course  I have reviewed the triage vital signs and the nursing notes.  Pertinent labs & imaging results that were available during my care of the patient were reviewed by me and considered in my medical decision making (see chart for details).    40 year old female presents with bronchitis.  Chest x-ray was obtained and was independent reviewed by me.  Interpretation: Normal chest x-ray.  No evidence of pneumonia.  Placing on prednisone, albuterol.  Promethazine DM for cough.  Final Clinical Impressions(s) / UC Diagnoses   Final diagnoses:  Bronchitis     Discharge Instructions     Medications as prescribed.  Chest xray clear.  Take care  Dr. Adriana Simas    ED Prescriptions    Medication Sig Dispense Auth. Provider   predniSONE (DELTASONE) 50 MG tablet 1 tablet daily x 5 days 5 tablet Kirrah Mustin G, DO   albuterol (VENTOLIN HFA) 108 (90 Base) MCG/ACT inhaler Inhale 1-2 puffs into the lungs every 6 (six) hours as needed for wheezing or shortness of breath. 18 g Hardie Veltre G, DO   promethazine-dextromethorphan (PROMETHAZINE-DM) 6.25-15 MG/5ML syrup Take 5 mLs by mouth 4 (four) times daily as needed for cough. 118 mL Tommie Sams, DO     PDMP not reviewed this encounter.   Tommie Sams, [place] 10/08/20 1806

## 2020-10-08 NOTE — ED Triage Notes (Signed)
Patient complains of cough x 11 days. States that she has pain in her chest with coughing. States that she did have covid in January. States that she thought at first this was allergies but has continued.

## 2020-10-08 NOTE — Discharge Instructions (Addendum)
Medications as prescribed.  Chest xray clear.  Take care  Dr. Adriana Simas

## 2020-10-23 IMAGING — US US EXTREM LOW VENOUS*L*
1 series · 13 of 24 positions shown · non-contrast
Comparison: None.

CLINICAL DATA: 38-year-old female with chest and leg pain for the
past 2 days.



[Series 1: us extrem low venous*left* · 13 of 34 slices shown]
[im 1/34]
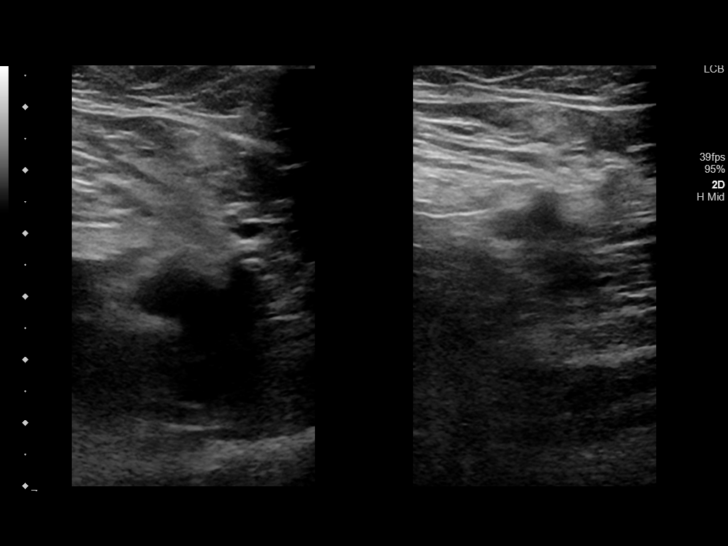
[im 3/34]
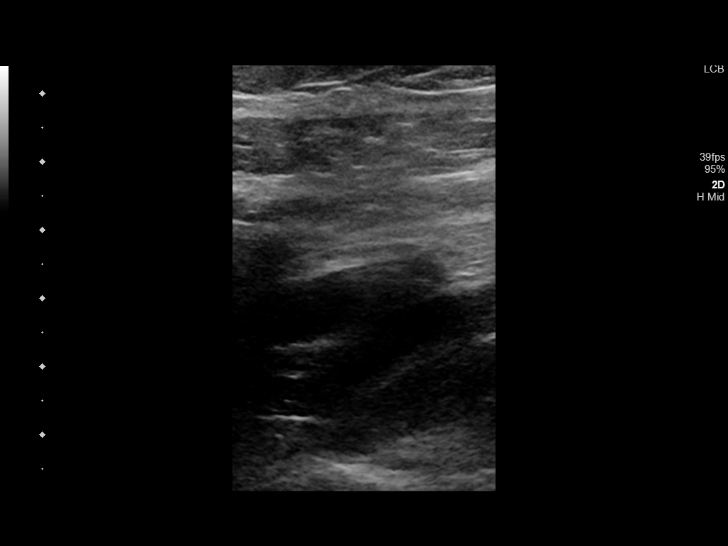
[im 6/34]
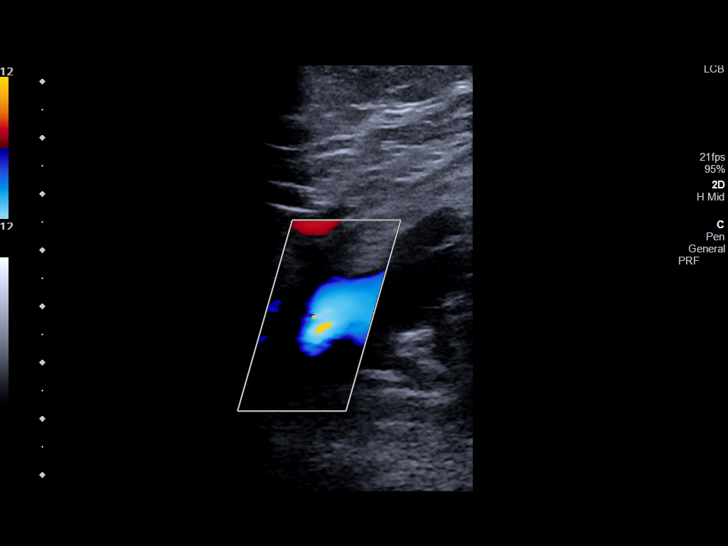
[im 9/34]
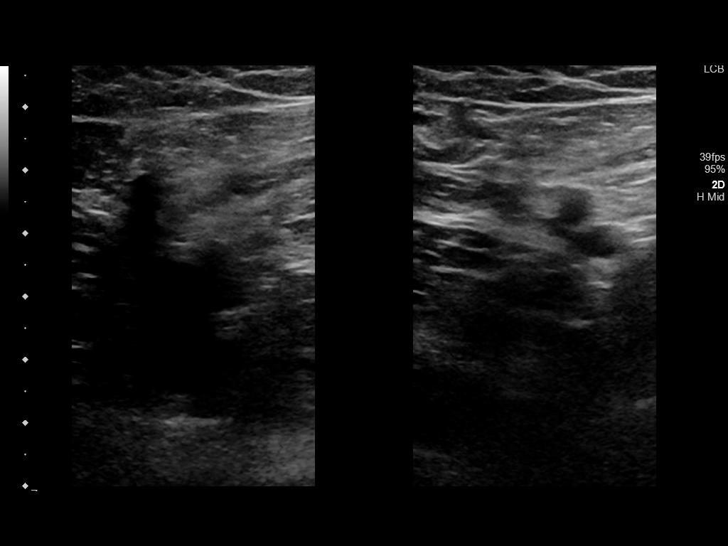
[im 12/34]
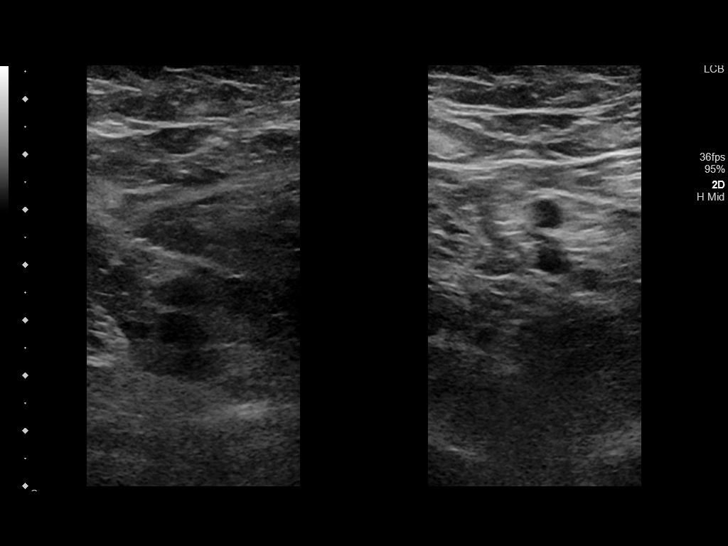
[im 15/34]
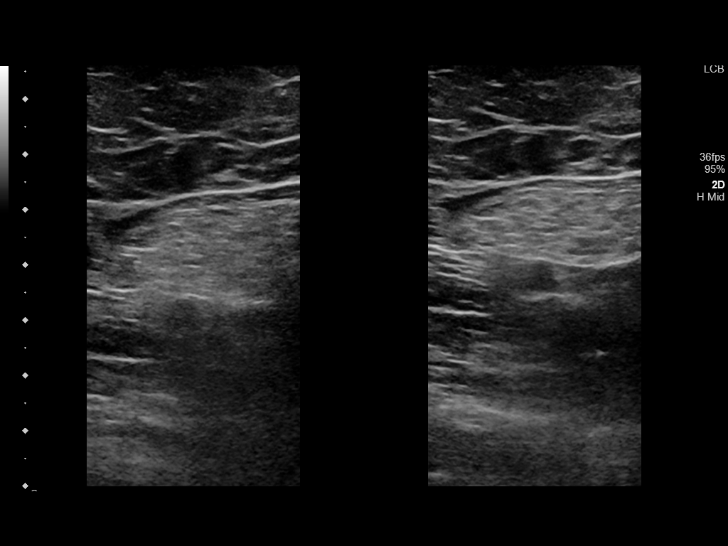
[im 18/34]
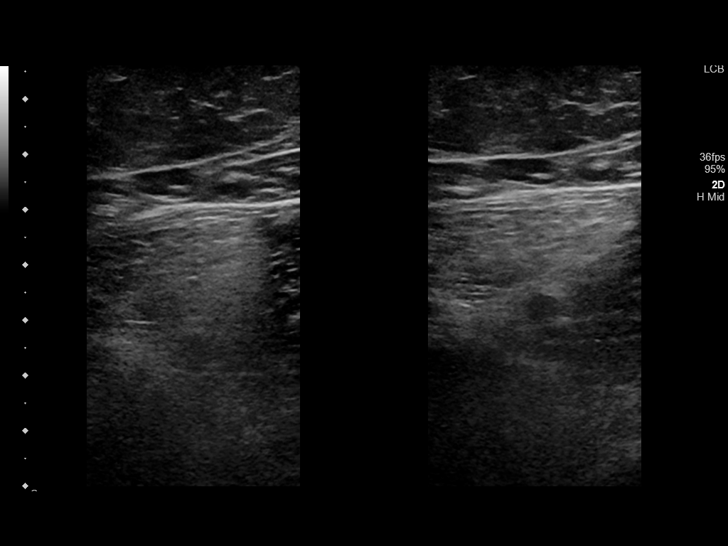
[im 19/34]
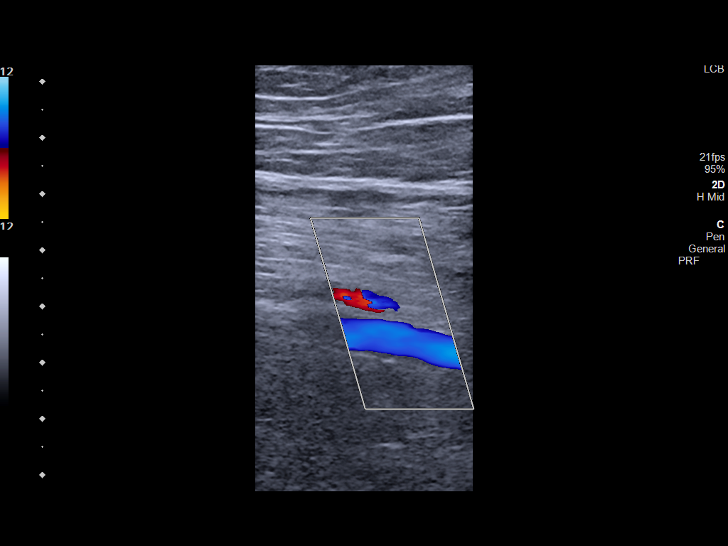
[im 22/34]
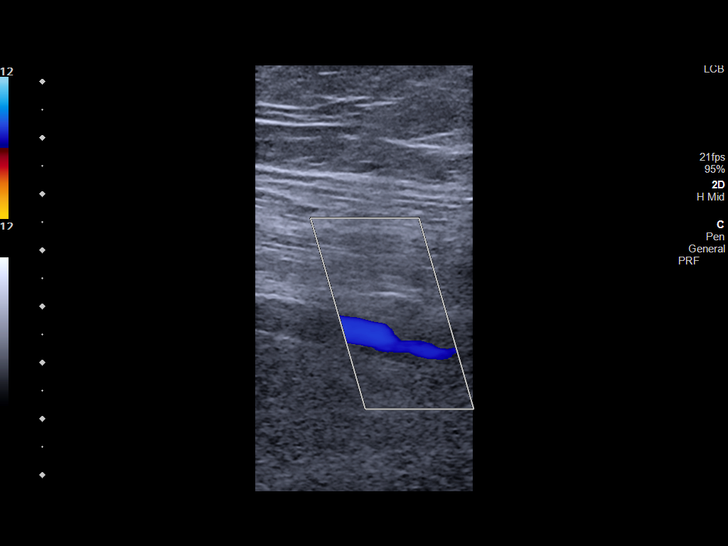
[im 25/34]
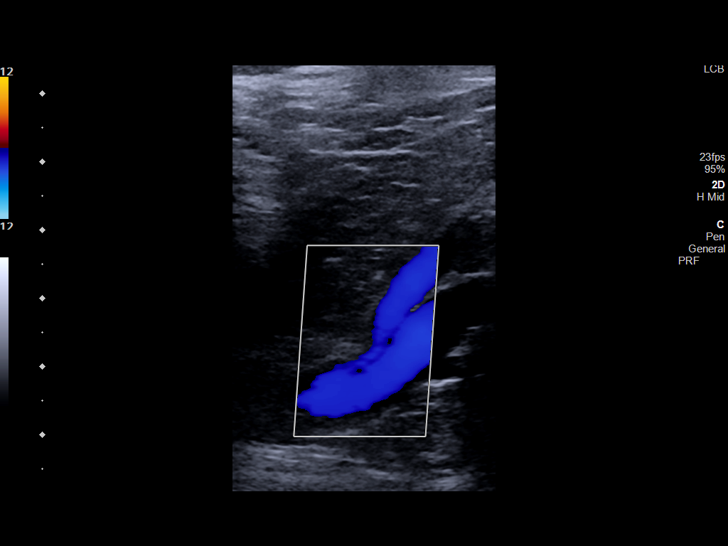
[im 28/34]
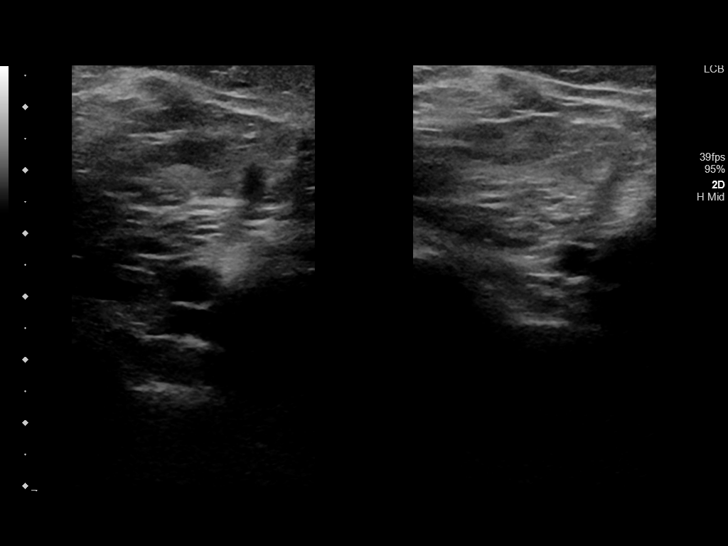
[im 31/34]
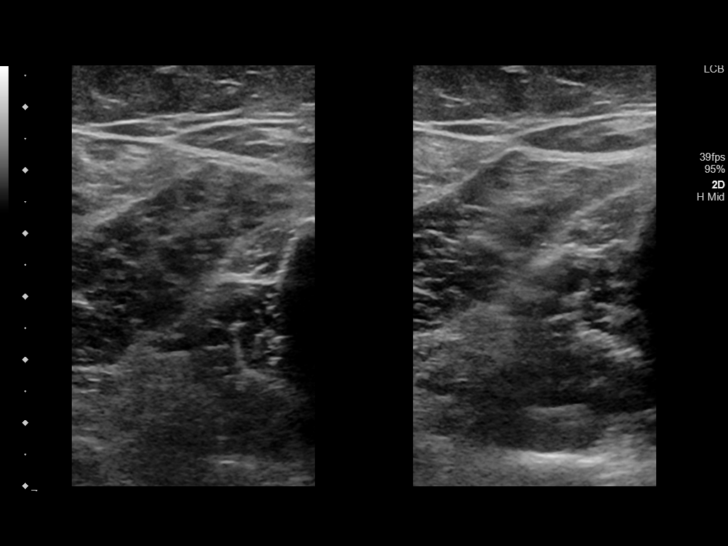
[im 34/34]
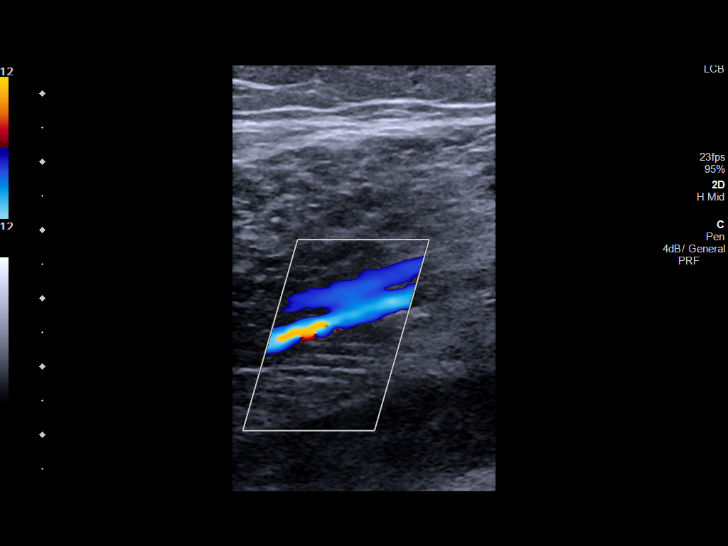

[13 of 24 positions shown; findings below may reference images not displayed]

FINDINGS: Contralateral Common Femoral Vein: Respiratory phasicity is normal
and symmetric with the symptomatic side. No evidence of thrombus.
Normal compressibility.

Common Femoral Vein: No evidence of thrombus. Normal
compressibility, respiratory phasicity and response to augmentation.

Saphenofemoral Junction: No evidence of thrombus. Normal
compressibility and flow on color Doppler imaging.

Profunda Femoral Vein: No evidence of thrombus. Normal
compressibility and flow on color Doppler imaging.

Femoral Vein: No evidence of thrombus. Normal compressibility,
respiratory phasicity and response to augmentation.

Popliteal Vein: No evidence of thrombus. Normal compressibility,
respiratory phasicity and response to augmentation.

Calf Veins: No evidence of thrombus. Normal compressibility and flow
on color Doppler imaging.

Superficial Great Saphenous Vein: No evidence of thrombus. Normal
compressibility.

Venous Reflux:  None.

Other Findings:  None.
IMPRESSION: No evidence of deep venous thrombosis.

## 2022-01-15 IMAGING — CR DG CHEST 2V
2 series · 2 of 2 positions shown · non-contrast
Comparison: 07/17/2019

CLINICAL DATA: Cough for 11 days.  Chest discomfort.

EXAM:
CHEST - 2 VIEW

[chest pa]
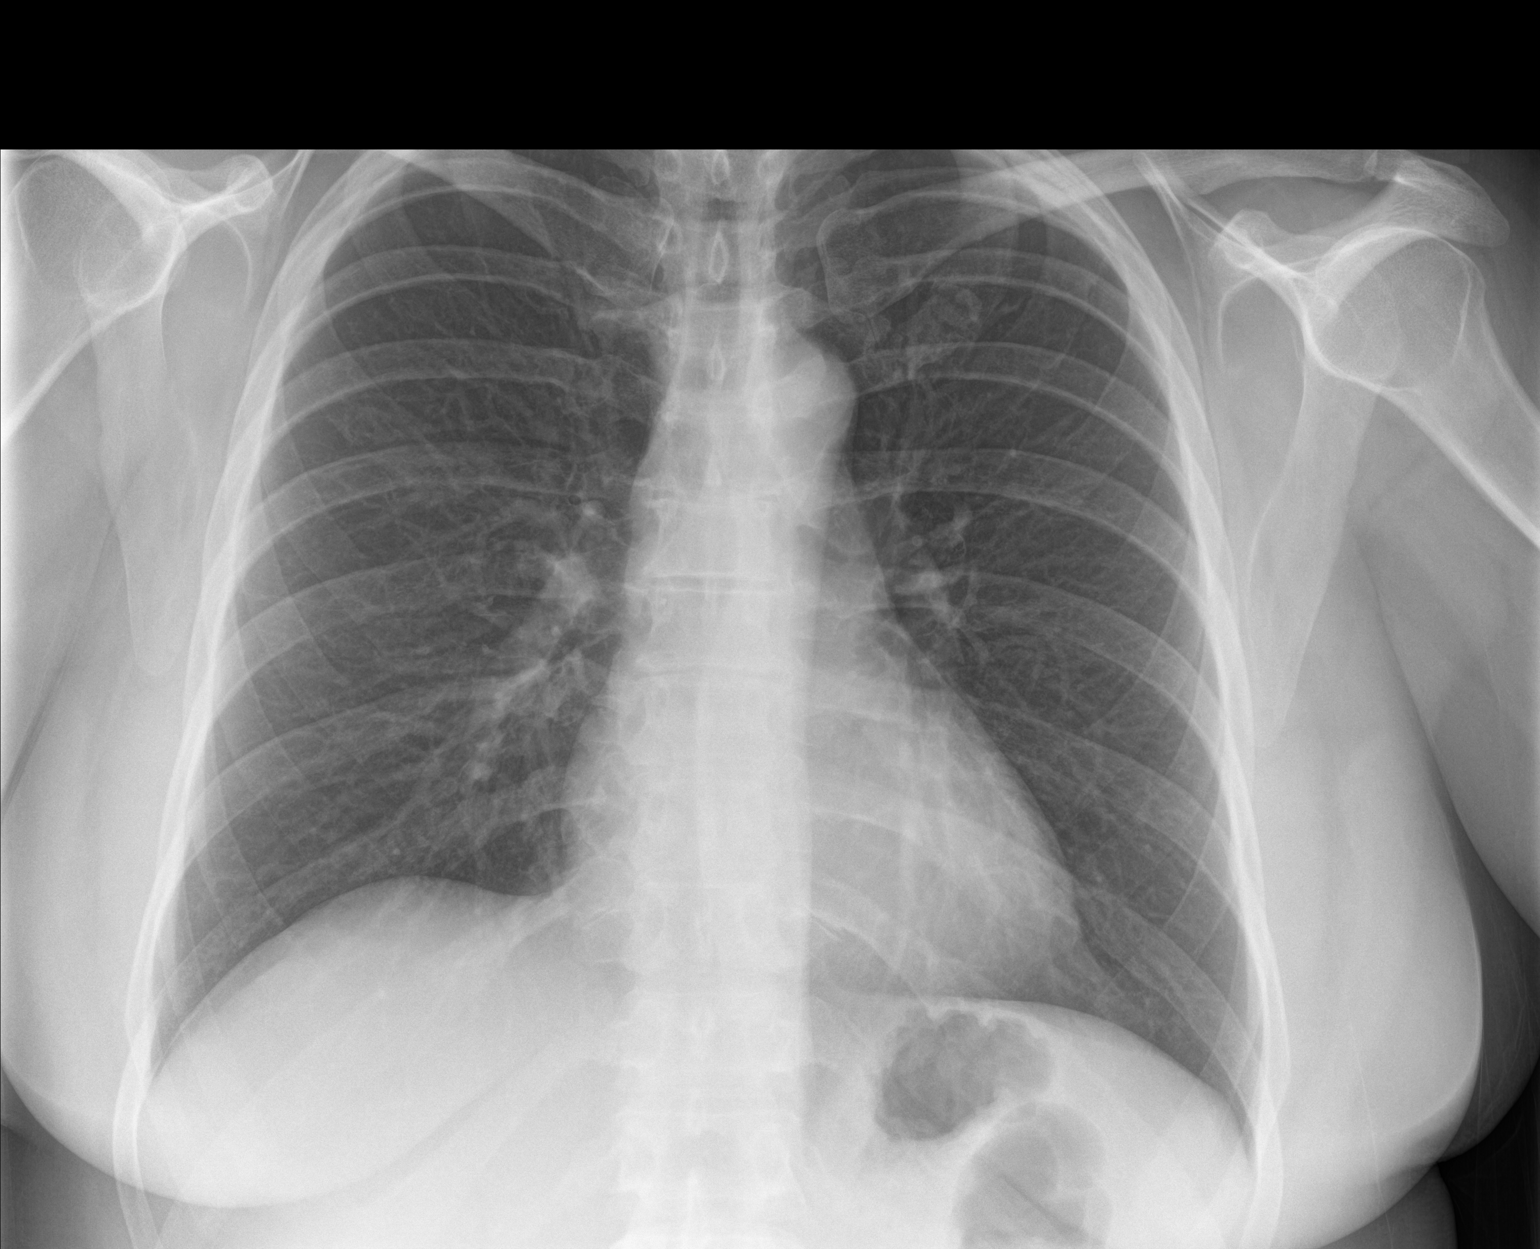

[chest lat]
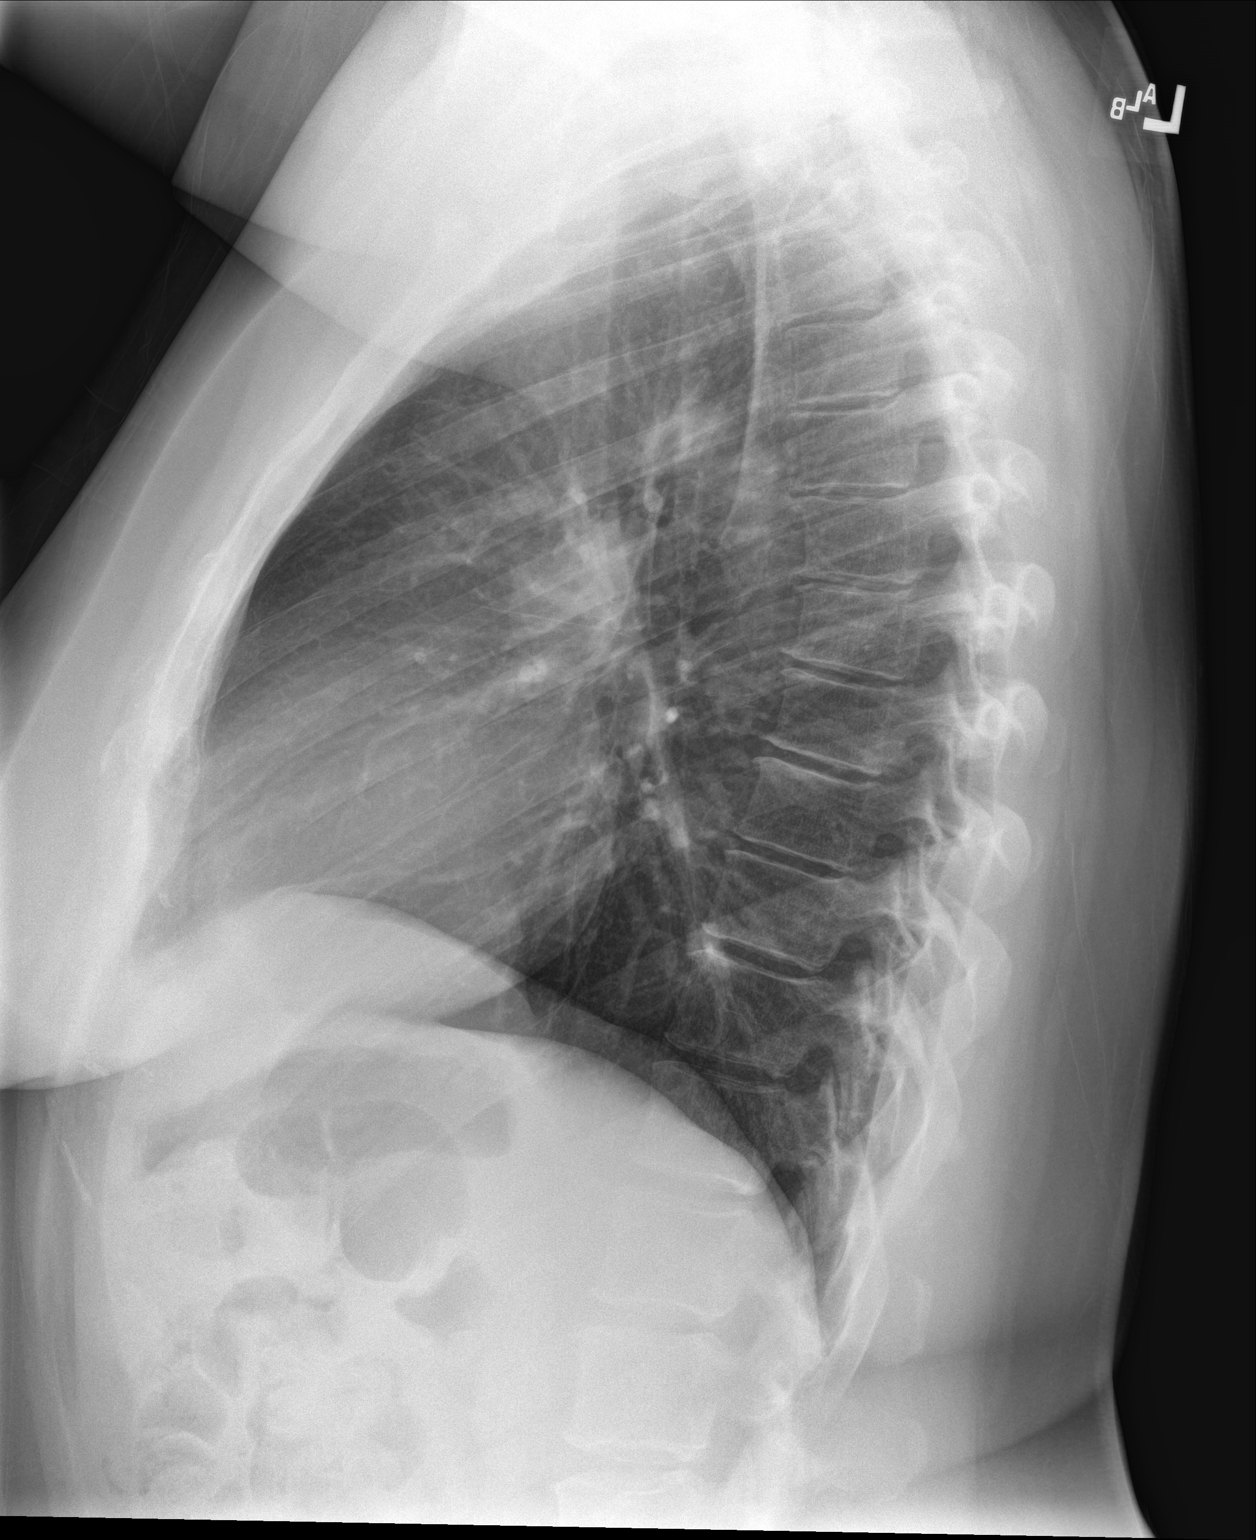

[2 of 2 positions shown; findings below may reference images not displayed]

FINDINGS: The heart size and mediastinal contours are within normal limits.
Both lungs are clear. The visualized skeletal structures are
unremarkable.
IMPRESSION: No active cardiopulmonary disease.
